# Patient Record
Sex: Male | Born: 1985 | Hispanic: No | Marital: Single | Smoking: Current every day smoker
Health system: Southern US, Community
[De-identification: ages and names within clinical notes are randomized; demographics above are authoritative.]

---

## 2008-03-22 ENCOUNTER — Emergency Department (HOSPITAL_COMMUNITY): Admission: EM | Admit: 2008-03-22 | Discharge: 2008-03-22 | Payer: Self-pay | Admitting: Emergency Medicine

## 2009-05-14 IMAGING — CT CT ABDOMEN W/ CM
1 of 3 series · 14 of 32 positions shown, 19 images · IV contrast (omnipaque)
Comparison: None.

CT ABDOMEN

CLINICAL DATA: Diffuse abdominal pain.  Nausea and vomiting.

CT ABDOMEN AND PELVIS WITH CONTRAST  03/22/2008:
TECHNIQUE: Multidetector CT imaging of the abdomen and pelvis was
performed using the standard protocol following bolus
administration of intravenous contrast.
Contrast: 100 ml Qmnipaque-I77 IV.  Dilute Omnipaque as oral
contrast.

[Series 2: abd_pel 5.0 b40f st · axial · 0.74mm/px · z∈[-508,-63]mm · 14 of 101 slices shown, 19 images]
[im 6/101  soft-tissue]
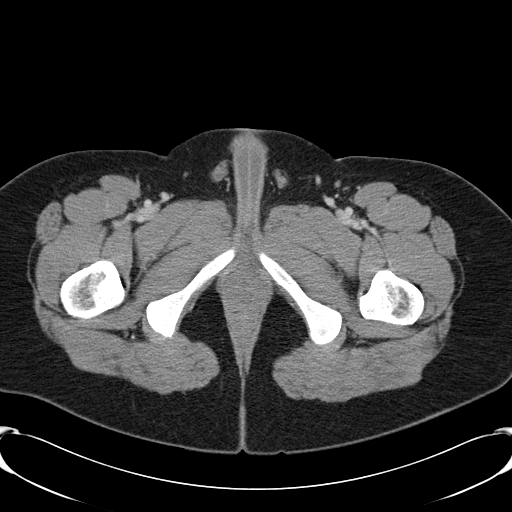
[im 6/101  bone]
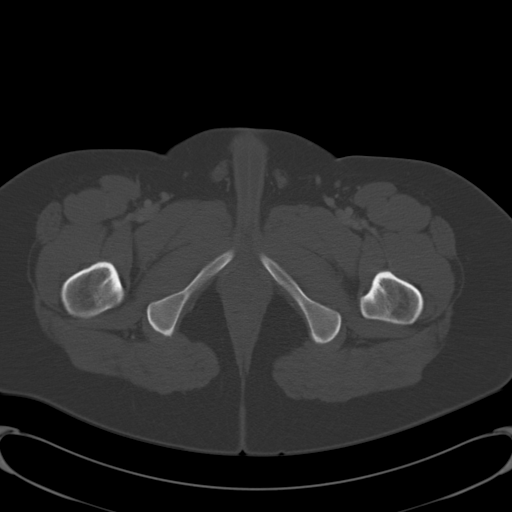
[im 12/101  soft-tissue]
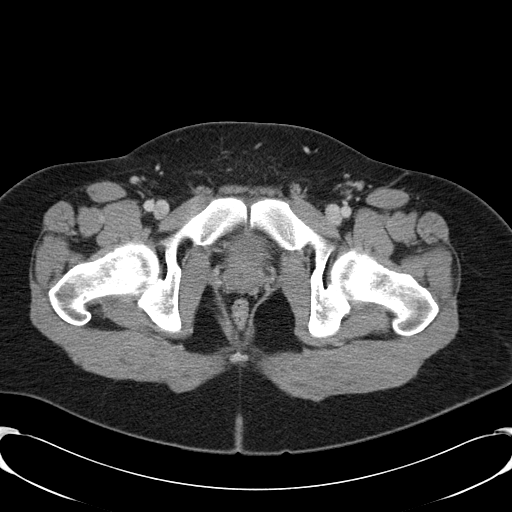
[im 23/101  soft-tissue]
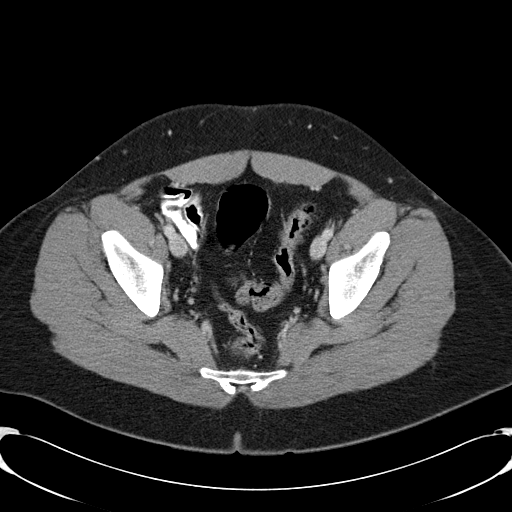
[im 28/101  soft-tissue]
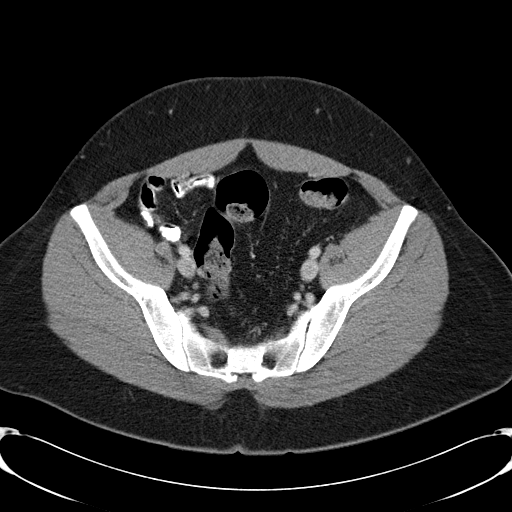
[im 34/101  soft-tissue]
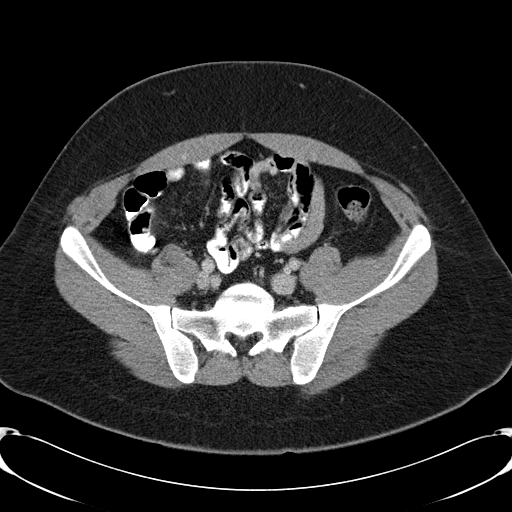
[im 45/101  soft-tissue]
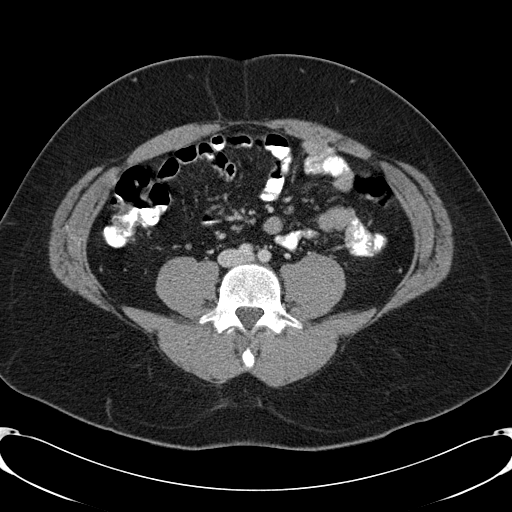
[im 51/101  soft-tissue]
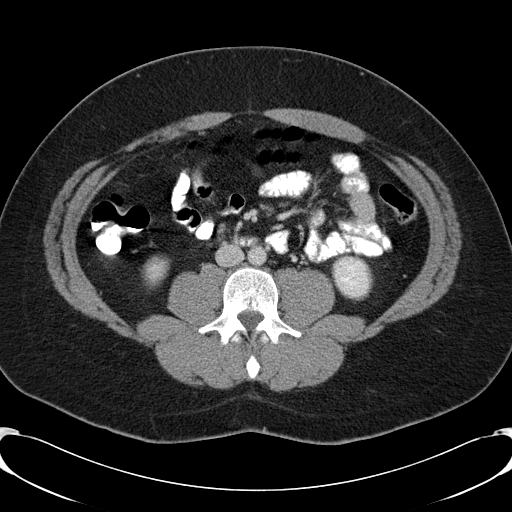
[im 56/101  soft-tissue]
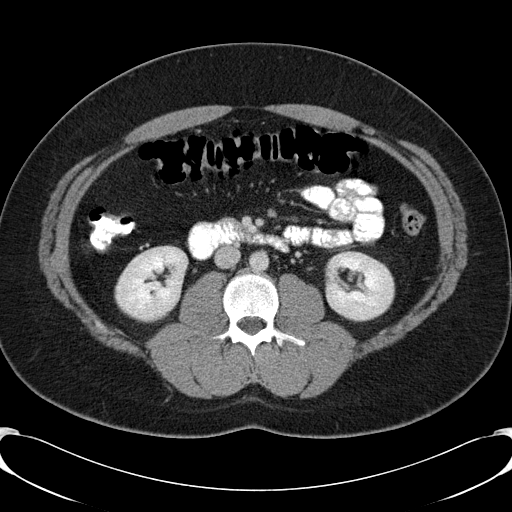
[im 67/101  soft-tissue]
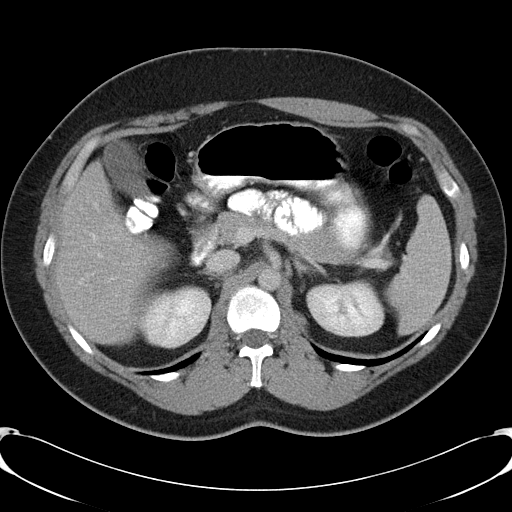
[im 67/101  bone]
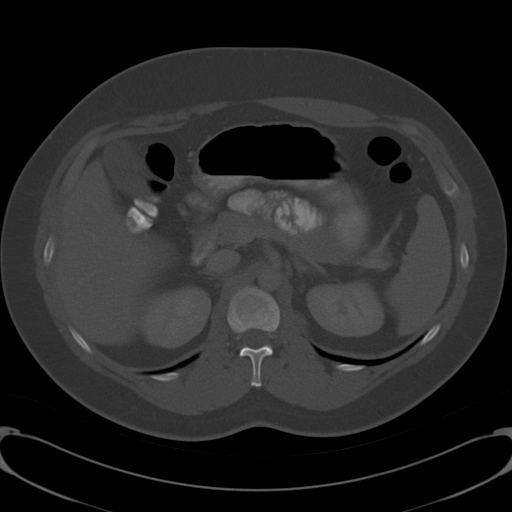
[im 73/101  soft-tissue]
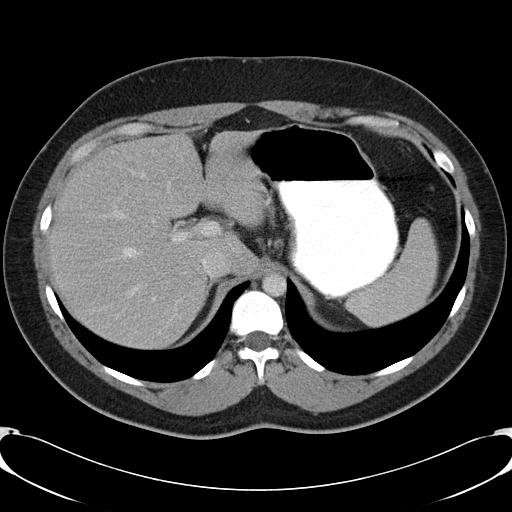
[im 78/101  soft-tissue]
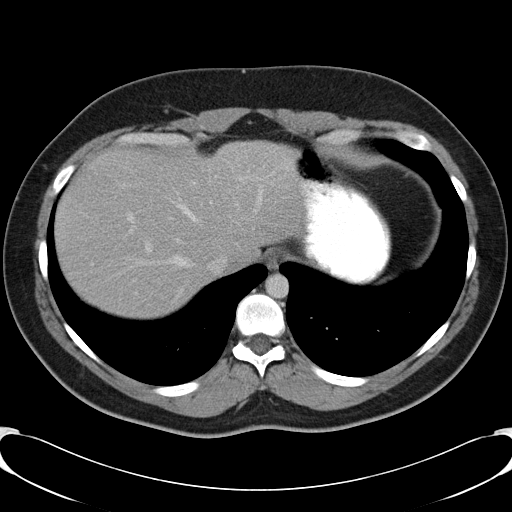
[im 78/101  lung]
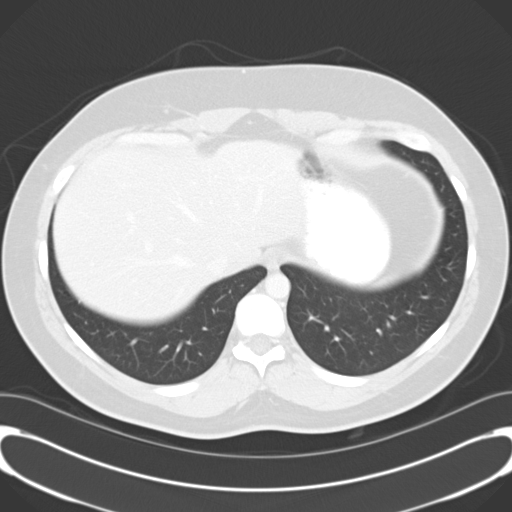
[im 84/101  lung]
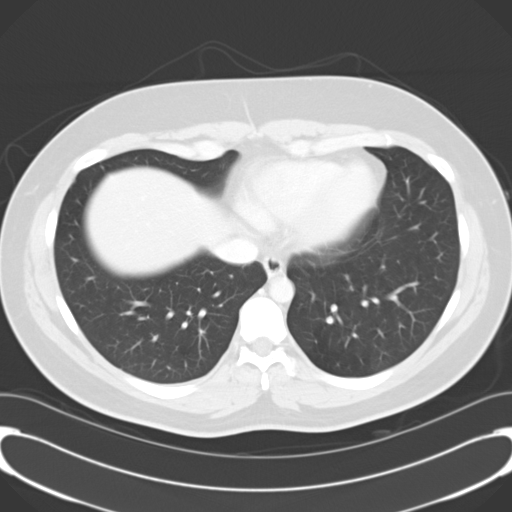
[im 89/101  soft-tissue]
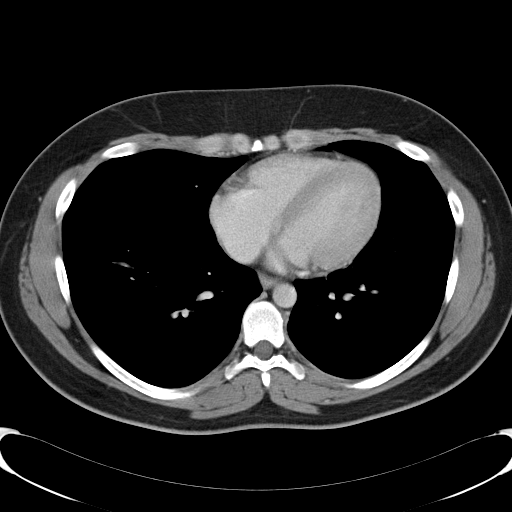
[im 89/101  lung]
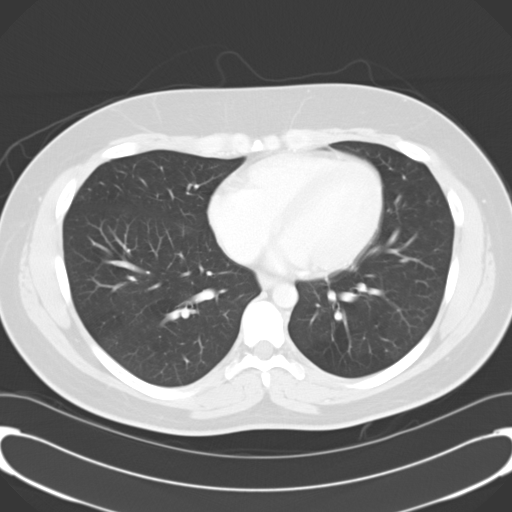
[im 95/101  soft-tissue]
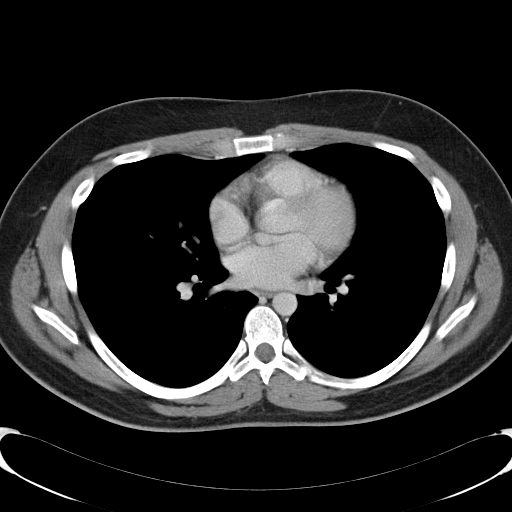
[im 95/101  lung]
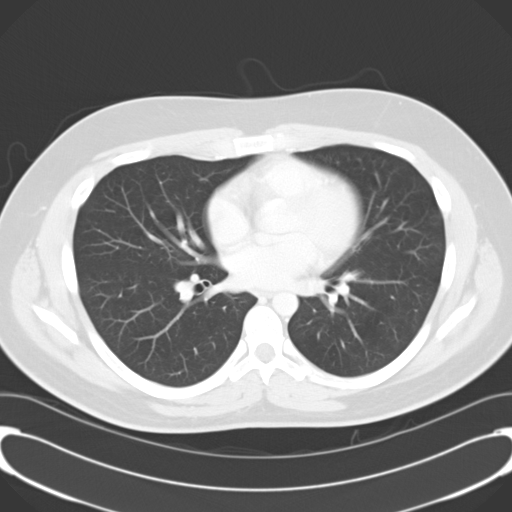

[14 of 32 positions shown; findings below may reference images not displayed]

FINDINGS: Normal appearing liver, spleen, pancreas, adrenal
glands, and kidneys.  Gallbladder unremarkable by CT.  No biliary
ductal dilation.  Stomach and visualized small bowel and colon
unremarkable.  Normal appearing abdominal aorta with widely patent
visceral arteries.  No significant lymphadenopathy.  No free fluid.
Visualized lung bases clear.  Bone window images unremarkable.
IMPRESSION: Normal CT of the abdomen.

CT PELVIS
FINDINGS: Normal appearing appendix in the right upper pelvis.
Visualized small bowel and colon unremarkable.  Urinary bladder
decompressed and unremarkable.  Prostate gland and seminal vesicles
normal for age.  No significant lymphadenopathy.  No free fluid.
Bone window images unremarkable.
IMPRESSION: Normal CT of the pelvis.

## 2011-01-23 LAB — URINE MICROSCOPIC-ADD ON

## 2011-01-23 LAB — COMPREHENSIVE METABOLIC PANEL
ALT: 34
AST: 27
Albumin: 4.4
BUN: 11
Calcium: 9.5
Chloride: 105
Sodium: 141
Total Bilirubin: 0.8

## 2011-01-23 LAB — DIFFERENTIAL
Basophils Absolute: 0
Basophils Relative: 0
Monocytes Relative: 1 — ABNORMAL LOW
Neutro Abs: 16.9 — ABNORMAL HIGH

## 2011-01-23 LAB — CBC
MCV: 87.9
Platelets: 334
RDW: 12.9
WBC: 17.9 — ABNORMAL HIGH

## 2011-01-23 LAB — URINALYSIS, ROUTINE W REFLEX MICROSCOPIC
Glucose, UA: NEGATIVE
Hgb urine dipstick: NEGATIVE
Leukocytes, UA: NEGATIVE
Nitrite: NEGATIVE

## 2020-02-22 ENCOUNTER — Ambulatory Visit
Admission: EM | Admit: 2020-02-22 | Discharge: 2020-02-22 | Disposition: A | Discharge status: Home / Self Care | Payer: 59 | Attending: Emergency Medicine | Admitting: Emergency Medicine

## 2020-02-22 ENCOUNTER — Other Ambulatory Visit: Payer: Self-pay

## 2020-02-22 DIAGNOSIS — M545 Low back pain, unspecified: Secondary | ICD-10-CM

## 2020-02-22 DIAGNOSIS — W19XXXA Unspecified fall, initial encounter: Secondary | ICD-10-CM

## 2020-02-22 MED ORDER — CYCLOBENZAPRINE HCL 5 MG PO TABS: 5.0000 mg | ORAL_TABLET | Freq: Two times a day (BID) | ORAL | 0 refills | Status: AC | PRN

## 2020-02-22 MED ORDER — NAPROXEN 500 MG PO TABS: 500.0000 mg | ORAL_TABLET | Freq: Two times a day (BID) | ORAL | 0 refills | Status: AC

## 2020-02-22 NOTE — Discharge Instructions (Addendum)

## 2020-02-22 NOTE — ED Triage Notes (Signed)
Pt states he sustained a fall on Sunday morning and has pain on his left lower back and has a limited ROM and cannot push/lift without pain to the area. Pt is aox4 and ambulatory.

## 2020-02-22 NOTE — ED Provider Notes (Signed)
[facility]    CSN: [id number] Arrival date & time: 02/22/20  1648      History   Chief Complaint Chief Complaint  Patient presents with  . Back Pain    lower due to a fall on Sunday morning    HPI Clayton Summers is a 34 y.o. male  presents for left low back pain.  States pain is stiff, achy, tight.  Has not tried anything for relief.  State pain is result of fall from ground level onto stairs.  No head trauma, LOC.  Denies fever, saddle area anesthesia, lower extremity numbness/weakness, urinary retention, fecal incontinence.  History reviewed. No pertinent past medical history.  There are no problems to display for this patient.   History reviewed. No pertinent surgical history.     Home Medications    Prior to Admission medications   Medication Sig Start Date End Date Taking? Authorizing Provider  cyclobenzaprine (FLEXERIL) 5 MG tablet Take 1 tablet (5 mg total) by mouth 2 (two) times daily as needed for up to 7 days for muscle spasms. 02/22/20 02/29/20  Hall-Potvin, [place], PA-C  naproxen (NAPROSYN) 500 MG tablet Take 1 tablet (500 mg total) by mouth 2 (two) times daily. 02/22/20   Hall-Potvin, [place], PA-C    Family History History reviewed. No pertinent family history.  Social History Social History   Tobacco Use  . Smoking status: Current Every Day Smoker  . Smokeless tobacco: Never Used  Vaping Use  . Vaping Use: Never used  Substance Use Topics  . Alcohol use: Yes    Comment: socially  . Drug use: Yes    Frequency: 1.0 times per week    Types: Marijuana     Allergies   Patient has no known allergies.   Review of Systems Review of Systems  Constitutional: Negative for fatigue and fever.  Respiratory: Negative for cough and shortness of breath.   Cardiovascular: Negative for chest pain and palpitations.  Gastrointestinal: Negative for abdominal pain, diarrhea and vomiting.  Musculoskeletal: Positive for back pain. Negative for  arthralgias, gait problem, myalgias, neck pain and neck stiffness.  Skin: Negative for rash and wound.  Neurological: Negative for speech difficulty and headaches.  All other systems reviewed and are negative.    Physical Exam Triage Vital Signs ED Triage Vitals  Enc Vitals Group     BP      Pulse      Resp      Temp      Temp src      SpO2      Weight      Height      Head Circumference      Peak Flow      Pain Score      Pain Loc      Pain Edu?      Excl. in GC?    No data found.  Updated Vital Signs BP 117/76 (BP Location: Right Arm)   Pulse 81   Temp 98.3 F (36.8 C) (Oral)   Resp 20   SpO2 97%   Visual Acuity Right Eye Distance:   Left Eye Distance:   Bilateral Distance:    Right Eye Near:   Left Eye Near:    Bilateral Near:     Physical Exam Constitutional:      General: He is not in acute distress. HENT:     Head: Normocephalic and atraumatic.  Eyes:     General: No scleral icterus.  Pupils: Pupils are equal, round, and reactive to light.  Cardiovascular:     Rate and Rhythm: Normal rate.  Pulmonary:     Effort: Pulmonary effort is normal. No respiratory distress.     Breath sounds: No wheezing.  Musculoskeletal:        General: Tenderness present. No swelling. Normal range of motion.     Cervical back: Normal.     Thoracic back: Normal.       Back:  Skin:    Coloration: Skin is not jaundiced or pale.  Neurological:     Mental Status: He is alert and oriented to person, place, and time.      UC Treatments / Results  Labs (all labs ordered are listed, but only abnormal results are displayed) Labs Reviewed - No data to display  EKG   Radiology No results found.  Procedures Procedures (including critical care time)  Medications Ordered in UC Medications - No data to display  Initial Impression / Assessment and Plan / UC Course  I have reviewed the triage vital signs and the nursing notes.  Pertinent labs & imaging  results that were available during my care of the patient were reviewed by me and considered in my medical decision making (see chart for details).     No bony deformity/TTP, bruising, abrasion.  XR deferred at this time.  Will trial supportive care as below.  Provided contact info for ortho f/u prn.  Return precautions discussed, pt verbalized understanding and is agreeable to plan. Final Clinical Impressions(s) / UC Diagnoses   Final diagnoses:  Fall, initial encounter  Acute left-sided low back pain without sciatica     Discharge Instructions     Heat therapy (hot compress, warm wash rag, hot showers, etc.) can help relax muscles and soothe muscle aches. Cold therapy (ice packs) can be used to help swelling both after injury and after prolonged use of areas of chronic pain/aches.  Pain medication:  500 mg Naprosyn/Aleve (naproxen) every 12 hours with food:  AVOID other NSAIDs while taking this (may have Tylenol).  May take muscle relaxer as needed for severe pain / spasm.  (This medication may cause you to become tired so it is important you do not drink alcohol or operate heavy machinery while on this medication.  Recommend your first dose to be taken before bedtime to monitor for side effects safely)  Important to follow up with specialist(s) below for further evaluation/management if your symptoms persist or worsen.    ED Prescriptions    Medication Sig Dispense Auth. Provider   naproxen (NAPROSYN) 500 MG tablet Take 1 tablet (500 mg total) by mouth 2 (two) times daily. 30 tablet Hall-Potvin, [place], PA-C   cyclobenzaprine (FLEXERIL) 5 MG tablet Take 1 tablet (5 mg total) by mouth 2 (two) times daily as needed for up to 7 days for muscle spasms. 14 tablet Hall-Potvin, [place], PA-C     I have reviewed the PDMP during this encounter.   Odette Fraction [place], [place] 02/22/20 1817
